# Patient Record
Sex: Female | Born: 1990 | Hispanic: Yes
Health system: Southern US, Community
[De-identification: ages and names within clinical notes are randomized; demographics above are authoritative.]

## PROBLEM LIST (undated history)

## (undated) HISTORY — PX: NO PAST SURGERIES: SHX2092

---

## 2019-05-09 ENCOUNTER — Ambulatory Visit: Payer: Self-pay | Attending: Internal Medicine

## 2019-05-09 DIAGNOSIS — Z23 Encounter for immunization: Secondary | ICD-10-CM

## 2019-05-09 NOTE — Progress Notes (Signed)
   Covid-19 Vaccination Clinic  Name:  Gabrielle Johnson    MRN: [id number] DOB: 1990/07/15  05/09/2019  [name] was observed post Covid-19 immunization for 15 minutes without incident. She was provided with Vaccine Information Sheet and instruction to access the V-Safe system.   [name] was instructed to call 911 with any severe reactions post vaccine: Marland Kitchen Difficulty breathing  . Swelling of face and throat  . A fast heartbeat  . A bad rash all over body  . Dizziness and weakness   Immunizations Administered    Name Date Dose VIS Date Route   Pfizer COVID-19 Vaccine 05/09/2019  5:15 PM 0.3 mL 01/25/2019 Intramuscular   Manufacturer: [employer], [employer]   Lot: [id number]   NDC: [address]-2

## 2019-06-03 ENCOUNTER — Ambulatory Visit: Payer: Self-pay | Attending: Internal Medicine

## 2019-06-03 DIAGNOSIS — Z23 Encounter for immunization: Secondary | ICD-10-CM

## 2019-06-03 NOTE — Progress Notes (Signed)
   Covid-19 Vaccination Clinic  Name:  Gabrielle Johnson    MRN: [id number] DOB: 08-08-1990  06/03/2019  [name] was observed post Covid-19 immunization for 15 minutes without incident. She was provided with Vaccine Information Sheet and instruction to access the V-Safe system.   [name] was instructed to call 911 with any severe reactions post vaccine: Marland Kitchen Difficulty breathing  . Swelling of face and throat  . A fast heartbeat  . A bad rash all over body  . Dizziness and weakness   Immunizations Administered    Name Date Dose VIS Date Route   Pfizer COVID-19 Vaccine 06/03/2019  4:38 PM 0.3 mL 04/10/2018 Intramuscular   Manufacturer: [employer], [employer]   Lot: [id number]   NDC: [address]-5

## 2019-12-24 ENCOUNTER — Other Ambulatory Visit: Payer: Self-pay

## 2019-12-24 DIAGNOSIS — Z20822 Contact with and (suspected) exposure to covid-19: Secondary | ICD-10-CM

## 2019-12-25 LAB — NOVEL CORONAVIRUS, NAA: SARS-CoV-2, NAA: NOT DETECTED

## 2019-12-25 LAB — SARS-COV-2, NAA 2 DAY TAT

## 2019-12-27 ENCOUNTER — Telehealth: Payer: Self-pay | Admitting: *Deleted

## 2019-12-27 NOTE — Telephone Encounter (Signed)
Pt notified of negative COVID-19 results. Understanding verbalized, with assistance from interpreter # [phone].

## 2020-04-09 LAB — OB RESULTS CONSOLE ANTIBODY SCREEN: Antibody Screen: NEGATIVE

## 2020-04-09 LAB — OB RESULTS CONSOLE ABO/RH: RH Type: POSITIVE

## 2020-04-09 LAB — OB RESULTS CONSOLE GC/CHLAMYDIA
Chlamydia: NEGATIVE
Gonorrhea: NEGATIVE

## 2020-04-09 LAB — OB RESULTS CONSOLE RPR: RPR: NONREACTIVE

## 2020-04-09 LAB — OB RESULTS CONSOLE RUBELLA ANTIBODY, IGM: Rubella: IMMUNE

## 2020-04-09 LAB — OB RESULTS CONSOLE HEPATITIS B SURFACE ANTIGEN: Hepatitis B Surface Ag: NEGATIVE

## 2020-04-09 LAB — OB RESULTS CONSOLE HIV ANTIBODY (ROUTINE TESTING): HIV: NONREACTIVE

## 2020-04-10 ENCOUNTER — Other Ambulatory Visit: Payer: Self-pay | Admitting: Nurse Practitioner

## 2020-04-10 DIAGNOSIS — Z369 Encounter for antenatal screening, unspecified: Secondary | ICD-10-CM

## 2020-04-27 ENCOUNTER — Encounter: Payer: Self-pay | Admitting: *Deleted

## 2020-04-28 ENCOUNTER — Other Ambulatory Visit: Payer: Self-pay

## 2020-04-28 ENCOUNTER — Encounter: Payer: Self-pay | Admitting: *Deleted

## 2020-04-28 ENCOUNTER — Ambulatory Visit: Payer: Self-pay | Attending: Obstetrics

## 2020-04-28 ENCOUNTER — Ambulatory Visit: Payer: Self-pay | Admitting: *Deleted

## 2020-04-28 ENCOUNTER — Ambulatory Visit: Payer: Self-pay

## 2020-04-28 VITALS — BP 110/50 | HR 79 | Ht 60.0 in | Wt 146.0 lb

## 2020-04-28 DIAGNOSIS — Z369 Encounter for antenatal screening, unspecified: Secondary | ICD-10-CM | POA: Insufficient documentation

## 2020-04-28 IMAGING — US US MFM FETAL NUCHAL TRANSLUCENCY
1 series · 14 of 28 positions shown · non-contrast
Comparison: none

[Series 1: us mfm fetal nuchal translucency · 14 of 98 slices shown]
[im 4/98]
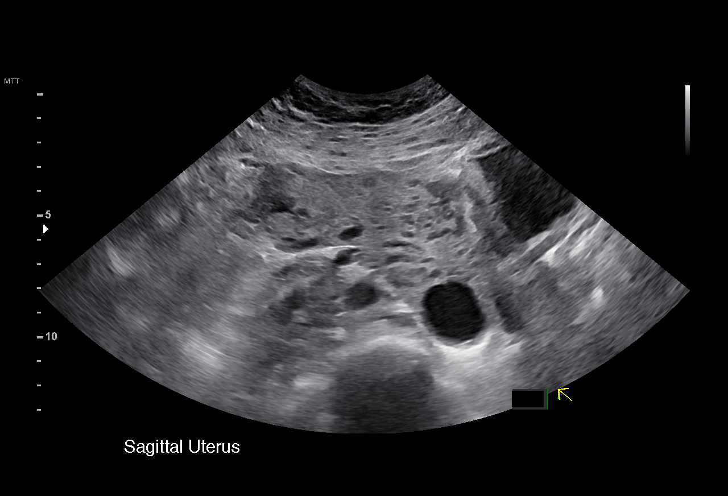
[im 11/98]
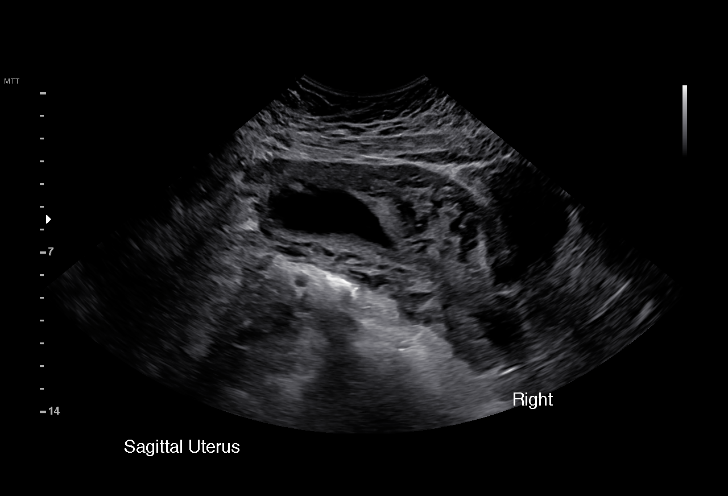
[im 18/98]
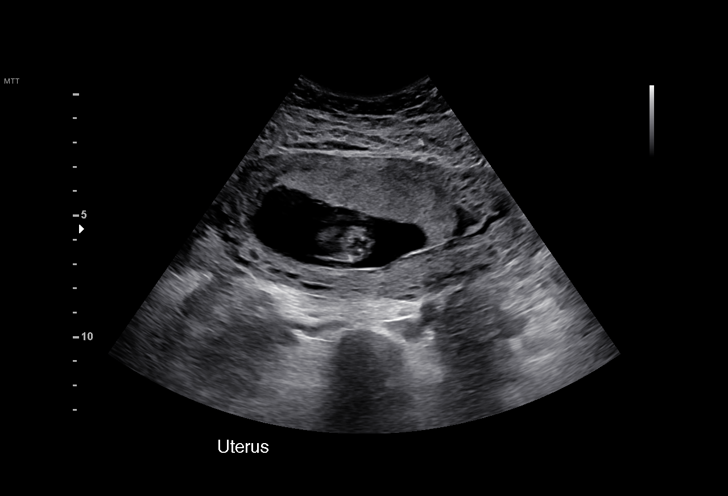
[im 26/98]
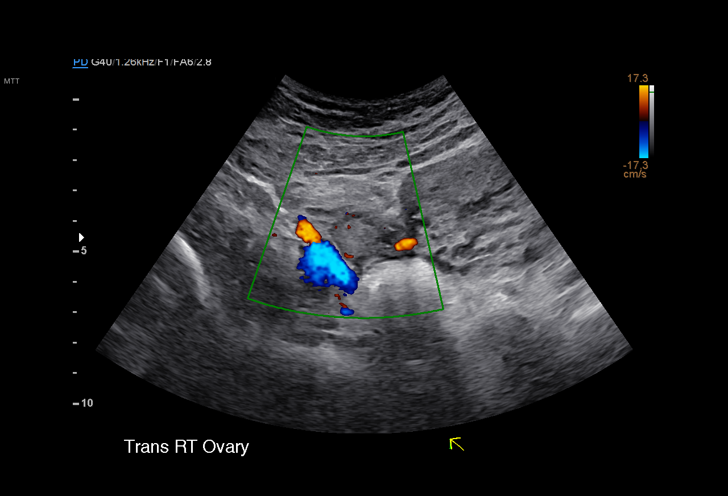
[im 33/98]
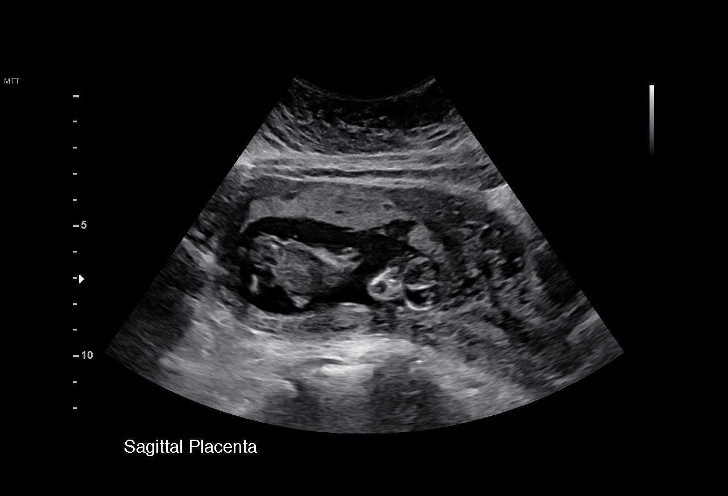
[im 40/98]
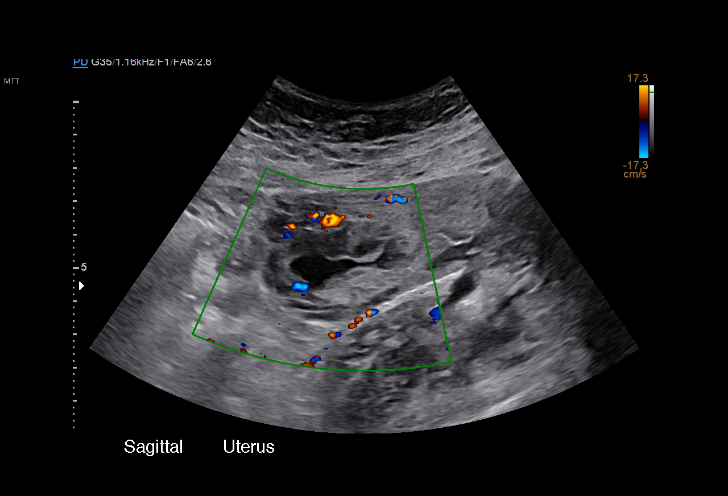
[im 47/98]
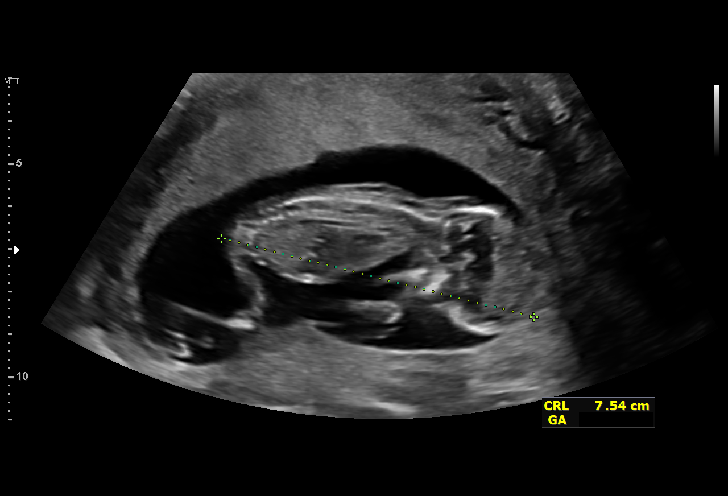
[im 54/98]
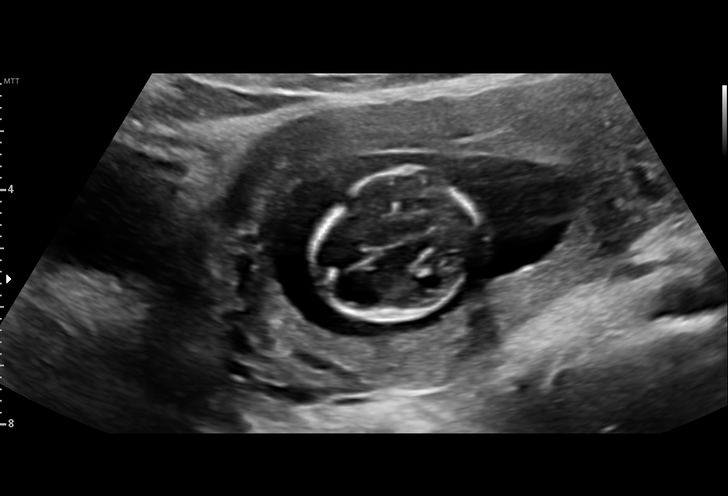
[im 62/98]
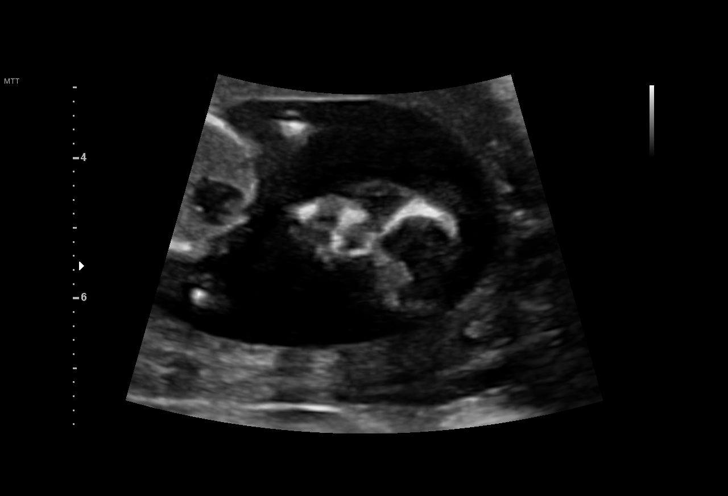
[im 69/98]
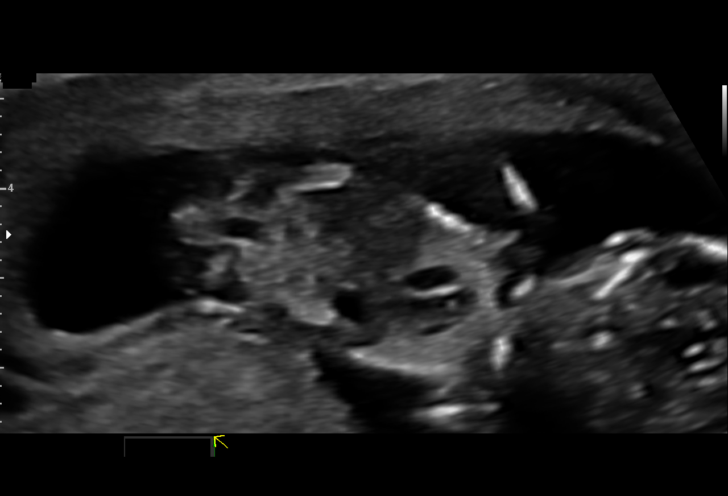
[im 76/98]
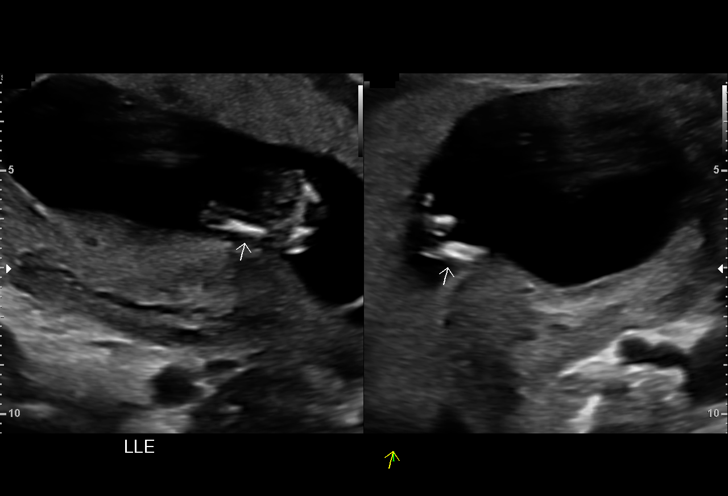
[im 83/98]
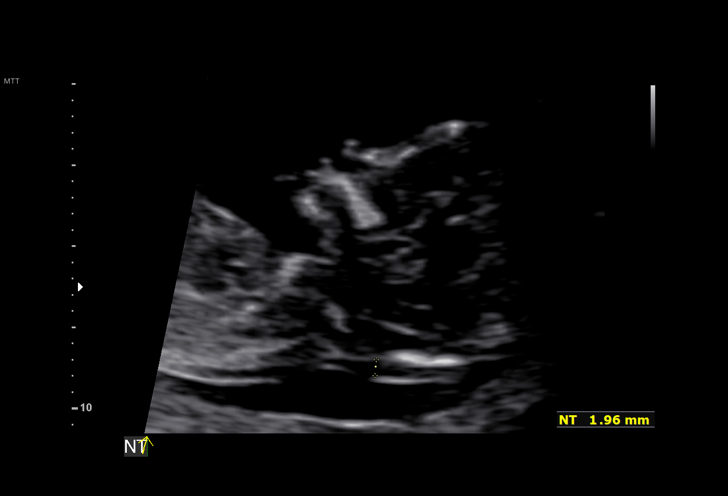
[im 90/98]
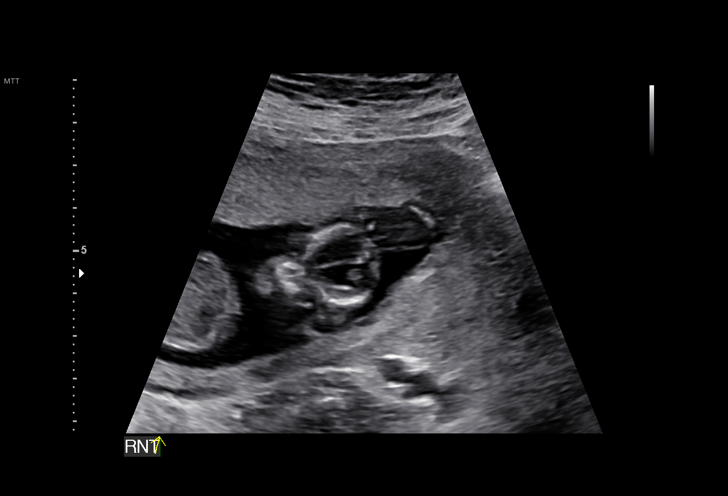
[im 98/98]
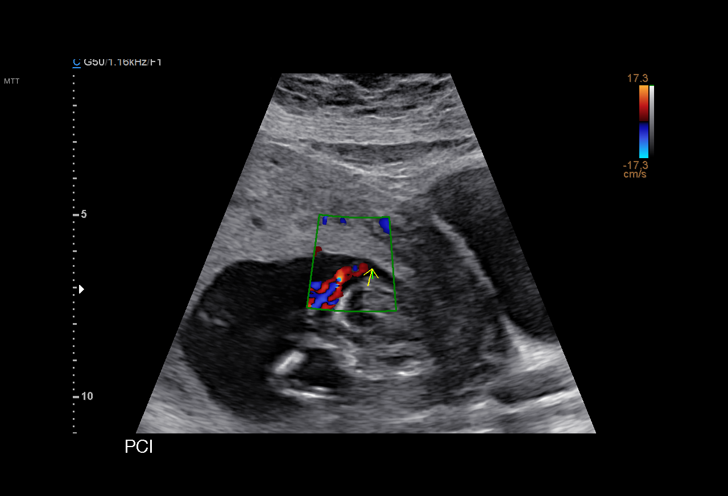

[14 of 28 positions shown; findings below may reference images not displayed]

Attending:        EMMANUEL APPIAH        Secondary Phy.:   EMMANUEL APPIAH
                                                            EMMANUEL APPIAH NP
                   [REDACTED]-                          [HOSPITAL] at
                   [73]

    TRANSLUCENCY

Indications

 13 weeks gestation of pregnancy
 Encounter for nuchal translucency              [73]
Fetal Evaluation

 Num Of Fetuses:         1
 Preg. Location:         Intrauterine
 Fetal Heart Rate(bpm):  145
 Cardiac Activity:       Observed
 Presentation:           Cephalic
 Placenta:               Anterior
 P. Cord Insertion:      Not well visualized

 Amniotic Fluid
 AFI FV:      Within normal limits

 Comment:    Possible Small subchorionic hemorrhage noted near Left fundus.
OB History

 Gravidity:    1         Term:   0        Prem:   0        SAB:   0
 TOP:          0       Ectopic:  0        Living: 0
Gestational Age

 LMP:           13w 1d        Date:  [DATE]                 EDD:   [DATE]
 Best:          13w 1d     Det. By:  LMP  ([DATE])          EDD:   [DATE]
1st Trimester Genetic Sonogram Screening

 CRL:            74.9  mm    G. Age:   13w 2d                 EDD:   [DATE]
 Nuc Trans:       2.0  mm

 Nasal Bone:                 Present
Anatomy

 Cranium:               Visualized             Abdominal Wall:         Visualized
 Choroid Plexus:        Visualized             Kidneys:                Visualized
 Posterior Fossa:       Visualized             Bladder:                Visualized
 Heart:                 Visualized             Spine:                  Visualized
 Diaphragm:             Visualized             Upper Extremities:      Visualized
 Stomach:               Visualized             Lower Extremities:      Visualized
 Abdomen:               Visualized

 Other:  Technically difficult due to early gestational age.
Cervix Uterus Adnexa

 Cervix
 Normal appearance by transabdominal scan.

 Uterus
 No abnormality visualized.

 Right Ovary
 Within normal limits.

 Left Ovary
 Within normal limits. Simple cyst measuring 2.8 cm.

 Cul De Sac
 No free fluid seen.

 Adnexa
 No abnormality visualized.
Impression

 On ultrasound, the CRL measurement is consistent with her
 previously-established dates and good fetal heart activity is
 seen. The nuchal translucency (NT) measures 2 millimeters,
 which is normal.  Fetal anatomy that could be ascertained at
 this gestational age is normal.

 I discussed the importance of first trimester screening, its
 significance, and limitations.  Patient understands that only
 invasive testing (chorionic villous sampling or amniocentesis)
 will give a definitive result on the fetal karyotype.
 Patient opted not to have first trimester screening.
 Counseled the patient with help of Spanish language
 interpreter (EMMANUEL APPIAH services).
Recommendations

 Fetal anatomy scan at 18-20 weeks.
                 EMMANUEL APPIAH

## 2020-09-11 ENCOUNTER — Inpatient Hospital Stay (HOSPITAL_COMMUNITY): Admission: AD | Admit: 2020-09-11 | Payer: Self-pay | Source: Home / Self Care | Admitting: Obstetrics and Gynecology

## 2020-10-23 ENCOUNTER — Other Ambulatory Visit: Payer: Self-pay | Admitting: Family Medicine

## 2020-10-23 DIAGNOSIS — Z3480 Encounter for supervision of other normal pregnancy, unspecified trimester: Secondary | ICD-10-CM | POA: Insufficient documentation

## 2020-10-23 MED ORDER — SOD CITRATE-CITRIC ACID 500-334 MG/5ML PO SOLN: 30.0000 mL | ORAL | Status: AC

## 2020-10-23 MED ORDER — CEFAZOLIN SODIUM-DEXTROSE 2-4 GM/100ML-% IV SOLN
2.0000 g | INTRAVENOUS | Status: AC
  Administered 2020-10-26: 2 g via INTRAVENOUS

## 2020-10-23 NOTE — Pre-Procedure Instructions (Signed)
Interpreter number [phone]

## 2020-10-24 NOTE — Anesthesia Preprocedure Evaluation (Addendum)
Anesthesia Evaluation  Patient identified by MRN, date of birth, ID band Patient awake    Reviewed: Allergy & Precautions, NPO status , Patient's Chart, lab work & pertinent test results  Airway Mallampati: III  TM Distance: >3 FB Neck ROM: Full    Dental no notable dental hx. (+) Dental Advisory Given   Pulmonary neg pulmonary ROS,    Pulmonary exam normal breath sounds clear to auscultation       Cardiovascular negative cardio ROS Normal cardiovascular exam Rhythm:Regular Rate:Normal     Neuro/Psych negative neurological ROS  negative psych ROS   GI/Hepatic negative GI ROS, Neg liver ROS,   Endo/Other  negative endocrine ROS  Renal/GU negative Renal ROS  negative genitourinary   Musculoskeletal negative musculoskeletal ROS (+)   Abdominal   Peds  Hematology negative hematology ROS (+)   Anesthesia Other Findings   Reproductive/Obstetrics (+) Pregnancy Breech                            Anesthesia Physical Anesthesia Plan  ASA: 2  Anesthesia Plan: Spinal   Post-op Pain Management:    Induction:   PONV Risk Score and Plan: 3 and Ondansetron, Dexamethasone and Treatment may vary due to age or medical condition  Airway Management Planned: Natural Airway and Nasal Cannula  Additional Equipment: None  Intra-op Plan:   Post-operative Plan:   Informed Consent: I have reviewed the patients History and Physical, chart, labs and discussed the procedure including the risks, benefits and alternatives for the proposed anesthesia with the patient or authorized representative who has indicated his/her understanding and acceptance.       Plan Discussed with: CRNA  Anesthesia Plan Comments:        Anesthesia Quick Evaluation

## 2020-10-25 NOTE — H&P (Signed)
OBSTETRIC ADMISSION HISTORY AND PHYSICAL  Gabrielle Johnson is a 30 y.o. female G1P0 with IUP at [redacted]w[redacted]d by LMP presenting for scheduled cesarean section for breech presentation. She reports +FMs, No LOF, no VB, no blurry vision, headaches or peripheral edema, and RUQ pain.  She plans on breast and bottle feeding. She request outpatient Nexplanon for birth control. She received her prenatal care at [facility]   Dating: By LMP --->  Estimated Date of Delivery: 11/02/20  Sono:    @[redacted]w[redacted]d , CWD, normal anatomy, breech presentation, posterior placental lie, 3250g, 71.8% EFW   Prenatal History/Complications:  ASCUS, HPV+ PAP > needs post partum colposcopy  Past Medical History: No past medical history on file.  Past Surgical History: Past Surgical History:  Procedure Laterality Date   NO PAST SURGERIES      Obstetrical History: OB History     Gravida  1   Para      Term      Preterm      AB      Living         SAB      IAB      Ectopic      Multiple      Live Births              Social History Social History   Socioeconomic History   Marital status: Unknown    Spouse name: Not on file   Number of children: Not on file   Years of education: Not on file   Highest education level: Not on file  Occupational History   Not on file  Tobacco Use   Smoking status: Not on file   Smokeless tobacco: Not on file  Substance and Sexual Activity   Alcohol use: Not on file   Drug use: Not on file   Sexual activity: Not on file  Other Topics Concern   Not on file  Social History Narrative   Not on file   Social Determinants of Health   Financial Resource Strain: Not on file  Food Insecurity: Not on file  Transportation Needs: Not on file  Physical Activity: Not on file  Stress: Not on file  Social Connections: Not on file    Family History: No family history on file.  Allergies: No Known Allergies  No medications prior to admission.     Review of Systems    All systems reviewed and negative except as stated in HPI  Last menstrual period 01/27/2020. General appearance: alert, cooperative, and appears stated age Lungs: clear to auscultation bilaterally Heart: regular rate and rhythm Abdomen: soft, non-tender; bowel sounds normal Extremities: no sign of DVT Fetal monitoring : 134 bpm by doppler     Prenatal labs: ABO, Rh: O/Positive/-- (02/24 0000) Antibody: Negative (02/24 0000) Rubella: Immune (02/24 0000) RPR: Nonreactive (02/24 0000)  HBsAg: Negative (02/24 0000)  HIV: Non-reactive (02/24 0000)  GBS:   Negative (10/08/2020 1 hr Glucola Normal (78) Genetic screening Negative Quad screen  Anatomy 10/10/2020 normal  Prenatal Transfer Tool  Maternal Diabetes: No Genetic Screening: Normal Maternal Ultrasounds/Referrals: Normal Fetal Ultrasounds or other Referrals:  None Maternal Substance Abuse:  No Significant Maternal Medications:  None Significant Maternal Lab Results: Group B Strep negative  No results found for this or any previous visit (from the past 24 hour(s)).  Patient Active Problem List   Diagnosis Date Noted   Supervision of other normal pregnancy, antepartum 10/23/2020    Assessment/Plan:  Gabrielle Johnson is a 29  y.o. G1P0 at [redacted]w[redacted]d here for Scheduled primary Cesarean Section for breech presentation  #Primary Scheduled Cesarean Section #Breech presentation The risks of cesarean section were discussed with the patient including but were not limited to: bleeding which may require transfusion or reoperation; infection which may require antibiotics; injury to bowel, bladder, ureters or other surrounding organs; injury to the fetus; need for additional procedures including hysterectomy in the event of a life-threatening hemorrhage; placental abnormalities wth subsequent pregnancies, incisional problems, thromboembolic phenomenon and other postoperative/anesthesia complications.  The patient concurred with the proposed plan,  giving informed written consent for the procedure.  Patient has been NPO since midnight she will remain NPO for procedure. Anesthesia and OR aware.  Preoperative prophylactic antibiotics and SCDs ordered on call to the OR.  To OR when ready.   #ID:  GBS negative #MOF: Breast and Bottle #MOC: Outpatient Nexplanon #Circ:  no  Venora Maples, MD/MPH Attending Family Medicine Physician, [facility] for [facility], [facility]  10/25/2020, 7:05 PM

## 2020-10-26 ENCOUNTER — Other Ambulatory Visit: Payer: Self-pay

## 2020-10-26 ENCOUNTER — Inpatient Hospital Stay (HOSPITAL_COMMUNITY)
Admission: AD | Admit: 2020-10-26 | Discharge: 2020-10-28 | DRG: 787 | Disposition: A | Discharge status: Home / Self Care | Payer: Medicaid Other | Attending: Obstetrics & Gynecology | Admitting: Obstetrics & Gynecology

## 2020-10-26 ENCOUNTER — Inpatient Hospital Stay (HOSPITAL_COMMUNITY): Payer: Medicaid Other | Admitting: Anesthesiology

## 2020-10-26 ENCOUNTER — Encounter (HOSPITAL_COMMUNITY): Payer: Self-pay | Admitting: Family Medicine

## 2020-10-26 ENCOUNTER — Encounter (HOSPITAL_COMMUNITY)
Admission: AD | Disposition: A | Discharge status: Home / Self Care | Payer: Self-pay | Source: Home / Self Care | Attending: Obstetrics & Gynecology

## 2020-10-26 ENCOUNTER — Other Ambulatory Visit: Payer: Self-pay | Admitting: Obstetrics & Gynecology

## 2020-10-26 DIAGNOSIS — Z20822 Contact with and (suspected) exposure to covid-19: Secondary | ICD-10-CM | POA: Diagnosis present

## 2020-10-26 DIAGNOSIS — R339 Retention of urine, unspecified: Secondary | ICD-10-CM | POA: Diagnosis not present

## 2020-10-26 DIAGNOSIS — O321XX0 Maternal care for breech presentation, not applicable or unspecified: Secondary | ICD-10-CM

## 2020-10-26 DIAGNOSIS — O9081 Anemia of the puerperium: Secondary | ICD-10-CM | POA: Diagnosis not present

## 2020-10-26 DIAGNOSIS — O99893 Other specified diseases and conditions complicating puerperium: Secondary | ICD-10-CM | POA: Diagnosis not present

## 2020-10-26 DIAGNOSIS — Z3A39 39 weeks gestation of pregnancy: Secondary | ICD-10-CM

## 2020-10-26 DIAGNOSIS — Z3480 Encounter for supervision of other normal pregnancy, unspecified trimester: Secondary | ICD-10-CM

## 2020-10-26 DIAGNOSIS — D62 Acute posthemorrhagic anemia: Secondary | ICD-10-CM | POA: Diagnosis not present

## 2020-10-26 DIAGNOSIS — Z98891 History of uterine scar from previous surgery: Secondary | ICD-10-CM

## 2020-10-26 LAB — CBC
HCT: 40.2 % (ref 36.0–46.0)
HCT: 35.7 % — ABNORMAL LOW (ref 36.0–46.0)
Hemoglobin: 13.4 g/dL (ref 12.0–15.0)
Hemoglobin: 11.9 g/dL — ABNORMAL LOW (ref 12.0–15.0)
MCH: 28.5 pg (ref 26.0–34.0)
MCH: 27.9 pg (ref 26.0–34.0)
MCHC: 33.3 g/dL (ref 30.0–36.0)
MCHC: 33.3 g/dL (ref 30.0–36.0)
MCV: 85.5 fL (ref 80.0–100.0)
MCV: 83.8 fL (ref 80.0–100.0)
Platelets: 324 10*3/uL (ref 150–400)
Platelets: 295 10*3/uL (ref 150–400)
RBC: 4.7 MIL/uL (ref 3.87–5.11)
RBC: 4.26 MIL/uL (ref 3.87–5.11)
RDW: 13.9 % (ref 11.5–15.5)
RDW: 14 % (ref 11.5–15.5)
WBC: 9.1 10*3/uL (ref 4.0–10.5)
WBC: 20.7 10*3/uL — ABNORMAL HIGH (ref 4.0–10.5)
nRBC: 0 % (ref 0.0–0.2)
nRBC: 0 % (ref 0.0–0.2)

## 2020-10-26 LAB — RAPID HIV SCREEN (HIV 1/2 AB+AG)
HIV 1/2 Antibodies: NONREACTIVE
HIV-1 P24 Antigen - HIV24: NONREACTIVE

## 2020-10-26 LAB — RESP PANEL BY RT-PCR (FLU A&B, COVID) ARPGX2
Influenza A by PCR: NEGATIVE
Influenza B by PCR: NEGATIVE
SARS Coronavirus 2 by RT PCR: NEGATIVE

## 2020-10-26 LAB — TYPE AND SCREEN
ABO/RH(D): O POS
Antibody Screen: NEGATIVE

## 2020-10-26 LAB — RPR: RPR Ser Ql: NONREACTIVE

## 2020-10-26 LAB — CREATININE, SERUM
Creatinine, Ser: 0.53 mg/dL (ref 0.44–1.00)
GFR, Estimated: >60 mL/min (ref >60)

## 2020-10-26 SURGERY — Surgical Case
Anesthesia: Spinal | Wound class: Clean Contaminated

## 2020-10-26 MED ORDER — LACTATED RINGERS IV SOLN: INTRAVENOUS | Status: DC

## 2020-10-26 MED ORDER — OXYCODONE HCL 5 MG PO TABS
5.0000 mg | ORAL_TABLET | Freq: Once | ORAL | Status: DC | PRN
Start: 2020-10-26 — End: 2020-10-26

## 2020-10-26 MED ORDER — DIPHENHYDRAMINE HCL 25 MG PO CAPS: 25.0000 mg | ORAL_CAPSULE | Freq: Four times a day (QID) | ORAL | Status: DC | PRN

## 2020-10-26 MED ORDER — DIPHENHYDRAMINE HCL 25 MG PO CAPS: 25.0000 mg | ORAL_CAPSULE | ORAL | Status: DC | PRN

## 2020-10-26 MED ORDER — CEFAZOLIN SODIUM-DEXTROSE 2-4 GM/100ML-% IV SOLN
INTRAVENOUS | Status: AC
  Filled 2020-10-26: qty 100

## 2020-10-26 MED ORDER — FENTANYL CITRATE (PF) 100 MCG/2ML IJ SOLN
INTRAMUSCULAR | Status: AC
  Filled 2020-10-26: qty 2

## 2020-10-26 MED ORDER — OXYCODONE-ACETAMINOPHEN 5-325 MG PO TABS
2.0000 | ORAL_TABLET | ORAL | Status: DC | PRN
Start: 2020-10-26 — End: 2020-10-27

## 2020-10-26 MED ORDER — HYDROMORPHONE HCL 1 MG/ML IJ SOLN: 0.2500 mg | INTRAMUSCULAR | Status: DC | PRN

## 2020-10-26 MED ORDER — POVIDONE-IODINE 10 % EX SWAB
2.0000 "application " | Freq: Once | CUTANEOUS | Status: AC
  Administered 2020-10-26: 2 via TOPICAL

## 2020-10-26 MED ORDER — MEPERIDINE HCL 25 MG/ML IJ SOLN: 6.2500 mg | INTRAMUSCULAR | Status: DC | PRN

## 2020-10-26 MED ORDER — DIPHENHYDRAMINE HCL 50 MG/ML IJ SOLN: 12.5000 mg | INTRAMUSCULAR | Status: DC | PRN

## 2020-10-26 MED ORDER — MORPHINE SULFATE (PF) 0.5 MG/ML IJ SOLN
INTRAMUSCULAR | Status: AC
  Filled 2020-10-26: qty 10

## 2020-10-26 MED ORDER — SODIUM CHLORIDE 0.9% FLUSH: 3.0000 mL | INTRAVENOUS | Status: DC | PRN

## 2020-10-26 MED ORDER — MEASLES, MUMPS & RUBELLA VAC IJ SOLR: 0.5000 mL | Freq: Once | INTRAMUSCULAR | Status: DC

## 2020-10-26 MED ORDER — ACETAMINOPHEN 500 MG PO TABS
1000.0000 mg | ORAL_TABLET | Freq: Four times a day (QID) | ORAL | Status: AC
  Administered 2020-10-26 – 2020-10-27 (×4): 1000 mg via ORAL
  Filled 2020-10-26 (×4): qty 2

## 2020-10-26 MED ORDER — DEXAMETHASONE SODIUM PHOSPHATE 10 MG/ML IJ SOLN
INTRAMUSCULAR | Status: AC
  Filled 2020-10-26: qty 1

## 2020-10-26 MED ORDER — ENOXAPARIN SODIUM 40 MG/0.4ML IJ SOSY
40.0000 mg | PREFILLED_SYRINGE | INTRAMUSCULAR | Status: DC
  Administered 2020-10-26 – 2020-10-28 (×2): 40 mg via SUBCUTANEOUS
  Filled 2020-10-26 (×2): qty 0.4

## 2020-10-26 MED ORDER — KETOROLAC TROMETHAMINE 30 MG/ML IJ SOLN
INTRAMUSCULAR | Status: AC
  Filled 2020-10-26: qty 1

## 2020-10-26 MED ORDER — SCOPOLAMINE 1 MG/3DAYS TD PT72
MEDICATED_PATCH | TRANSDERMAL | Status: AC
  Filled 2020-10-26: qty 1

## 2020-10-26 MED ORDER — DEXAMETHASONE SODIUM PHOSPHATE 10 MG/ML IJ SOLN
INTRAMUSCULAR | Status: DC | PRN
  Administered 2020-10-26: 10 mg via INTRAVENOUS

## 2020-10-26 MED ORDER — NALBUPHINE HCL 10 MG/ML IJ SOLN
5.0000 mg | Freq: Once | INTRAMUSCULAR | Status: DC | PRN
Start: 2020-10-26 — End: 2020-10-28

## 2020-10-26 MED ORDER — OXYTOCIN-SODIUM CHLORIDE 30-0.9 UT/500ML-% IV SOLN
INTRAVENOUS | Status: DC | PRN
  Administered 2020-10-26: 30 [IU] via INTRAVENOUS

## 2020-10-26 MED ORDER — NALBUPHINE HCL 10 MG/ML IJ SOLN: 5.0000 mg | Freq: Once | INTRAMUSCULAR | Status: DC | PRN

## 2020-10-26 MED ORDER — FENTANYL CITRATE (PF) 100 MCG/2ML IJ SOLN
INTRAMUSCULAR | Status: DC | PRN
  Administered 2020-10-26: 15 ug via INTRATHECAL

## 2020-10-26 MED ORDER — MORPHINE SULFATE (PF) 0.5 MG/ML IJ SOLN
INTRAMUSCULAR | Status: DC | PRN
  Administered 2020-10-26: .15 mg via INTRATHECAL

## 2020-10-26 MED ORDER — DIBUCAINE (PERIANAL) 1 % EX OINT: 1.0000 "application " | TOPICAL_OINTMENT | CUTANEOUS | Status: DC | PRN

## 2020-10-26 MED ORDER — STERILE WATER FOR IRRIGATION IR SOLN
Status: DC | PRN
  Administered 2020-10-26: 1

## 2020-10-26 MED ORDER — SENNOSIDES-DOCUSATE SODIUM 8.6-50 MG PO TABS
2.0000 | ORAL_TABLET | Freq: Every day | ORAL | Status: DC
  Administered 2020-10-27 – 2020-10-28 (×2): 2 via ORAL
  Filled 2020-10-26 (×2): qty 2

## 2020-10-26 MED ORDER — NALOXONE HCL 0.4 MG/ML IJ SOLN: 0.4000 mg | INTRAMUSCULAR | Status: DC | PRN

## 2020-10-26 MED ORDER — KETOROLAC TROMETHAMINE 30 MG/ML IJ SOLN: 30.0000 mg | Freq: Four times a day (QID) | INTRAMUSCULAR | Status: AC | PRN

## 2020-10-26 MED ORDER — SIMETHICONE 80 MG PO CHEW
80.0000 mg | CHEWABLE_TABLET | ORAL | Status: DC | PRN
  Administered 2020-10-26: 80 mg via ORAL

## 2020-10-26 MED ORDER — PHENYLEPHRINE HCL-NACL 20-0.9 MG/250ML-% IV SOLN
INTRAVENOUS | Status: DC | PRN
  Administered 2020-10-26: 60 ug/min via INTRAVENOUS

## 2020-10-26 MED ORDER — SCOPOLAMINE 1 MG/3DAYS TD PT72
1.0000 | MEDICATED_PATCH | Freq: Once | TRANSDERMAL | Status: DC
  Administered 2020-10-26: 1.5 mg via TRANSDERMAL

## 2020-10-26 MED ORDER — SIMETHICONE 80 MG PO CHEW
80.0000 mg | CHEWABLE_TABLET | Freq: Three times a day (TID) | ORAL | Status: DC
  Administered 2020-10-27 – 2020-10-28 (×4): 80 mg via ORAL
  Filled 2020-10-26 (×5): qty 1

## 2020-10-26 MED ORDER — MENTHOL 3 MG MT LOZG: 1.0000 | LOZENGE | OROMUCOSAL | Status: DC | PRN

## 2020-10-26 MED ORDER — NALOXONE HCL 4 MG/10ML IJ SOLN
1.0000 ug/kg/h | INTRAVENOUS | Status: DC | PRN
  Filled 2020-10-26: qty 5

## 2020-10-26 MED ORDER — ZOLPIDEM TARTRATE 5 MG PO TABS: 5.0000 mg | ORAL_TABLET | Freq: Every evening | ORAL | Status: DC | PRN

## 2020-10-26 MED ORDER — PRENATAL MULTIVITAMIN CH
1.0000 | ORAL_TABLET | Freq: Every day | ORAL | Status: DC
  Administered 2020-10-27 – 2020-10-28 (×2): 1 via ORAL
  Filled 2020-10-26 (×2): qty 1

## 2020-10-26 MED ORDER — OXYTOCIN-SODIUM CHLORIDE 30-0.9 UT/500ML-% IV SOLN
INTRAVENOUS | Status: AC
  Filled 2020-10-26: qty 500

## 2020-10-26 MED ORDER — CEFAZOLIN SODIUM-DEXTROSE 2-4 GM/100ML-% IV SOLN: 2.0000 g | INTRAVENOUS | Status: DC

## 2020-10-26 MED ORDER — NALBUPHINE HCL 10 MG/ML IJ SOLN: 5.0000 mg | INTRAMUSCULAR | Status: DC | PRN

## 2020-10-26 MED ORDER — COCONUT OIL OIL: 1.0000 "application " | TOPICAL_OIL | Status: DC | PRN

## 2020-10-26 MED ORDER — ONDANSETRON HCL 4 MG/2ML IJ SOLN
INTRAMUSCULAR | Status: DC | PRN
  Administered 2020-10-26: 4 mg via INTRAVENOUS

## 2020-10-26 MED ORDER — BUPIVACAINE IN DEXTROSE 0.75-8.25 % IT SOLN
INTRATHECAL | Status: DC | PRN
  Administered 2020-10-26: 1.8 mL via INTRATHECAL

## 2020-10-26 MED ORDER — OXYCODONE HCL 5 MG/5ML PO SOLN
5.0000 mg | Freq: Once | ORAL | Status: DC | PRN
Start: 2020-10-26 — End: 2020-10-26

## 2020-10-26 MED ORDER — ONDANSETRON HCL 4 MG/2ML IJ SOLN
INTRAMUSCULAR | Status: AC
  Filled 2020-10-26: qty 2

## 2020-10-26 MED ORDER — OXYTOCIN-SODIUM CHLORIDE 30-0.9 UT/500ML-% IV SOLN
2.5000 [IU]/h | INTRAVENOUS | Status: AC
  Administered 2020-10-26: 2.5 [IU]/h via INTRAVENOUS

## 2020-10-26 MED ORDER — WITCH HAZEL-GLYCERIN EX PADS: 1.0000 "application " | MEDICATED_PAD | CUTANEOUS | Status: DC | PRN

## 2020-10-26 MED ORDER — ACETAMINOPHEN 325 MG PO TABS: 650.0000 mg | ORAL_TABLET | ORAL | Status: DC | PRN

## 2020-10-26 MED ORDER — PROMETHAZINE HCL 25 MG/ML IJ SOLN: 6.2500 mg | INTRAMUSCULAR | Status: DC | PRN

## 2020-10-26 MED ORDER — KETOROLAC TROMETHAMINE 30 MG/ML IJ SOLN
30.0000 mg | Freq: Once | INTRAMUSCULAR | Status: AC | PRN
  Administered 2020-10-26: 30 mg via INTRAVENOUS

## 2020-10-26 MED ORDER — ONDANSETRON HCL 4 MG/2ML IJ SOLN: 4.0000 mg | Freq: Three times a day (TID) | INTRAMUSCULAR | Status: DC | PRN

## 2020-10-26 MED ORDER — TETANUS-DIPHTH-ACELL PERTUSSIS 5-2.5-18.5 LF-MCG/0.5 IM SUSY: 0.5000 mL | PREFILLED_SYRINGE | Freq: Once | INTRAMUSCULAR | Status: DC

## 2020-10-26 MED ORDER — KETOROLAC TROMETHAMINE 30 MG/ML IJ SOLN
30.0000 mg | Freq: Four times a day (QID) | INTRAMUSCULAR | Status: AC | PRN
Start: 2020-10-26 — End: 2020-10-27
  Administered 2020-10-27: 30 mg via INTRAVENOUS
  Filled 2020-10-26: qty 1

## 2020-10-26 SURGICAL SUPPLY — 35 items
BENZOIN TINCTURE PRP APPL 2/3 (GAUZE/BANDAGES/DRESSINGS) ×2 IMPLANT
CHLORAPREP W/TINT 26ML (MISCELLANEOUS) ×2 IMPLANT
CLAMP CORD UMBIL (MISCELLANEOUS) IMPLANT
CLOTH BEACON ORANGE TIMEOUT ST (SAFETY) ×2 IMPLANT
DRSG OPSITE POSTOP 4X10 (GAUZE/BANDAGES/DRESSINGS) ×2 IMPLANT
ELECT REM PT RETURN 9FT ADLT (ELECTROSURGICAL) ×2
ELECTRODE REM PT RTRN 9FT ADLT (ELECTROSURGICAL) ×1 IMPLANT
EXTRACTOR VACUUM BELL STYLE (SUCTIONS) IMPLANT
GLOVE BIOGEL PI IND STRL 7.0 (GLOVE) ×2 IMPLANT
GLOVE BIOGEL PI INDICATOR 7.0 (GLOVE) ×2
GLOVE ECLIPSE 7.0 STRL STRAW (GLOVE) ×2 IMPLANT
GOWN STRL REUS W/TWL LRG LVL3 (GOWN DISPOSABLE) ×4 IMPLANT
KIT ABG SYR 3ML LUER SLIP (SYRINGE) ×2 IMPLANT
NEEDLE HYPO 25X5/8 SAFETYGLIDE (NEEDLE) ×2 IMPLANT
NS IRRIG 1000ML POUR BTL (IV SOLUTION) ×2 IMPLANT
PACK C SECTION WH (CUSTOM PROCEDURE TRAY) ×2 IMPLANT
PAD OB MATERNITY 4.3X12.25 (PERSONAL CARE ITEMS) ×2 IMPLANT
PENCIL SMOKE EVAC W/HOLSTER (ELECTROSURGICAL) ×2 IMPLANT
RETAINER VISCERAL (MISCELLANEOUS) ×2 IMPLANT
RTRCTR C-SECT PINK 25CM LRG (MISCELLANEOUS) ×2 IMPLANT
STRIP CLOSURE SKIN 1/2X4 (GAUZE/BANDAGES/DRESSINGS) ×2 IMPLANT
SUT MNCRL 0 VIOLET CTX 36 (SUTURE) ×2 IMPLANT
SUT MONOCRYL 0 CTX 36 (SUTURE) ×2
SUT PLAIN 0 NONE (SUTURE) IMPLANT
SUT PLAIN 2 0 (SUTURE) ×1
SUT PLAIN 2 0 XLH (SUTURE) IMPLANT
SUT PLAIN ABS 2-0 CT1 27XMFL (SUTURE) ×1 IMPLANT
SUT VIC AB 0 CTX 36 (SUTURE) ×1
SUT VIC AB 0 CTX36XBRD ANBCTRL (SUTURE) ×1 IMPLANT
SUT VIC AB 2-0 CT1 27 (SUTURE) ×1
SUT VIC AB 2-0 CT1 TAPERPNT 27 (SUTURE) ×1 IMPLANT
SUT VIC AB 4-0 KS 27 (SUTURE) ×2 IMPLANT
TOWEL OR 17X24 6PK STRL BLUE (TOWEL DISPOSABLE) ×2 IMPLANT
TRAY FOLEY W/BAG SLVR 14FR LF (SET/KITS/TRAYS/PACK) IMPLANT
WATER STERILE IRR 1000ML POUR (IV SOLUTION) ×2 IMPLANT

## 2020-10-26 NOTE — Discharge Summary (Signed)
Postpartum Discharge Summary      Patient Name: Gabrielle Johnson DOB: 02-27-1990 MRN: [id number]  Date of admission: 10/26/2020 Delivery date:10/26/2020  Delivering provider: Clarnce Flock  Date of discharge: 10/28/2020  Admitting diagnosis: S/P cesarean section [Z98.891] Intrauterine pregnancy: [redacted]w[redacted]d    Secondary diagnosis:  Active Problems:   Supervision of other normal pregnancy, antepartum   S/P cesarean section   Cesarean delivery delivered  Additional problems: None   Discharge diagnosis: Term Pregnancy Delivered                                              Post partum procedures: none Augmentation: None, scheduled c-section Complications: None  Hospital course: Scheduled C/S   30y.o. yo G1P1001 at 330w0das admitted to the hospital 10/26/2020 for scheduled cesarean section with the following indication:Malpresentation.Delivery details are as follows:  Membrane Rupture Time/Date: 10:35 AM ,10/26/2020   Delivery Method:C-Section, Low Transverse  Details of operation can be found in separate operative note.  Patient had a postpartum course remarkable for some urinary retention requiring her to have an indwelling catheter replaced overnight into POD#2, but after removal she is urinating without difficulty.  She is ambulating, tolerating a regular diet and passing flatus. Patient is discharged home in stable condition on  10/28/20 per her request for early d/c as long as the baby can go as well.        Newborn Data: Birth date:10/26/2020  Birth time:10:35 AM  Gender:Female  Living status:Living  Apgars:8 ,9  Weight:3375 g     Magnesium Sulfate received: No BMZ received: No Rhophylac:N/A MMR:N/A T-DaP:Given prenatally Flu: N/A Transfusion:No  Physical exam  Vitals:   10/27/20 0308 10/27/20 0800 10/27/20 1440 10/28/20 0602  BP: 105/65 108/63 120/73 118/79  Pulse: 68 64 69 64  Resp: _0 Temp: 98.2 F (36.8 C) 98.3 F (36.8 C) 98.1 F (36.7 C) 98 F  (36.7 C)  TempSrc: Oral Oral Oral Oral  SpO2: 99% 100% 99% 100%  Weight:      Height:       General: alert and cooperative Lochia: appropriate Uterine Fundus: firm Incision: honeycomb marked and unchanged DVT Evaluation: No evidence of DVT seen on physical exam. Labs: Lab Results  Component Value Date   WBC 12.7 (H) 10/27/2020   HGB 9.8 (L) 10/27/2020   HCT 29.3 (L) 10/27/2020   MCV 84.0 10/27/2020   PLT 255 10/27/2020   CMP Latest Ref Rng & Units 10/26/2020  Creatinine 0.44 - 1.00 mg/dL 0.53   Edinburgh Score: No flowsheet data found.   After visit meds:  Allergies as of 10/28/2020   No Known Allergies      Medication List     STOP taking these medications    clotrimazole 1 % cream Commonly known as: LOTRIMIN   hydrOXYzine 25 MG tablet Commonly known as: ATARAX/VISTARIL       TAKE these medications    ferrous sulfate 325 (65 FE) MG tablet Take 1 tablet (325 mg total) by mouth every other day.   ibuprofen 600 MG tablet Commonly known as: ADVIL Take 1 tablet (600 mg total) by mouth every 6 (six) hours as needed.   multivitamin-prenatal 27-0.8 MG Tabs tablet Take 1 tablet by mouth daily.   oxyCODONE 5 MG immediate release tablet Commonly known as: Oxy IR/ROXICODONE Take 1-2 tablets (  5-10 mg total) by mouth every 4 (four) hours as needed for moderate pain, severe pain or breakthrough pain.         Discharge home in stable condition Infant Feeding: Breast Infant Disposition:home with mother Discharge instruction: per After Visit Summary and Postpartum booklet. Activity: Advance as tolerated. Pelvic rest for 6 weeks.  Diet: routine diet Future Appointments: Future Appointments  Date Time Provider [place]  11/03/2020 10:20 AM [facility] NURSE [facility]   Follow up Visit:  Follow-up Information     Department, [facility]. Schedule an appointment as soon as possible for a visit in 4 week(s).   Why: For your postpartum  appointment. Contact information: [place] [address] [phone]                Message sent to [facility] by Dr. Cy Blamer on 9/12 for 1 week incision check Patient to call [facility] for 4-6 wk F/up PP appt  Please schedule this patient for a In person postpartum visit in 4 weeks with the following provider: Any provider. Additional Postpartum F/U:Incision check 1 week  Low risk pregnancy complicated by:  None Delivery mode:  C-Section, Low Transverse  Anticipated Birth Control:   Declines at this time - plans for Nexplanon at [facility]   10/28/2020 Myrtis Ser, CNM 11:09 AM

## 2020-10-26 NOTE — Anesthesia Procedure Notes (Signed)
Spinal  Patient location during procedure: OR Start time: 10/26/2020 10:02 AM End time: 10/26/2020 10:07 AM Reason for block: surgical anesthesia Staffing Performed: anesthesiologist  Anesthesiologist: Lannie Fields, DO Preanesthetic Checklist Completed: patient identified, IV checked, risks and benefits discussed, surgical consent, monitors and equipment checked, pre-op evaluation and timeout performed Spinal Block Patient position: sitting Prep: DuraPrep and site prepped and draped Patient monitoring: cardiac monitor, continuous pulse ox and blood pressure Approach: midline Location: L3-4 Injection technique: single-shot Needle Needle type: Pencan  Needle gauge: 24 G Needle length: 9 cm Assessment Sensory level: T6 Events: CSF return Additional Notes Functioning IV was confirmed and monitors were applied. Sterile prep and drape, including hand hygiene and sterile gloves were used. The patient was positioned and the spine was prepped. The skin was anesthetized with lidocaine.  Free flow of clear CSF was obtained prior to injecting local anesthetic into the CSF.  The spinal needle aspirated freely following injection.  The needle was carefully withdrawn.  The patient tolerated the procedure well.

## 2020-10-26 NOTE — Op Note (Addendum)
Operative Note   Patient: Gabrielle Johnson  Date of Procedure: 10/26/2020  Procedure: Primary Low Transverse Cesarean   Indications:  Breech presentation  Pre-operative Diagnosis: Breech, [redacted] weeks gestation  Post-operative Diagnosis: Same  TOLAC Candidate: Yes   Surgeon: Surgeon(s) and Role:    * Venora Maples, MD - Primary    * Warner Mccreedy, MD - Assisting   An experienced assistant was required given the standard of surgical care given the complexity of the case.  This assistant was needed for exposure, dissection, suctioning, retraction, instrument exchange, assisting with delivery with administration of fundal pressure, and for overall help during the procedure.   Anesthesia: spinal  Anesthesiologist: Dr. Lovell Sheehan   Antibiotics: Cefazolin   Estimated Blood Loss: 500 ml   Total IV Fluids: 2000 ml  Urine Output:  350 cc OF clear urine  Specimens: None   Complications: no complications   Indications: Gabrielle Johnson is a 30 y.o. G1P1001 with an IUP [redacted]w[redacted]d presenting for scheduled cesarean secondary to the indications listed above. Clinical course notable for uncomplicated course in PACU prior to scheduled cesarean delivery.  The risks of cesarean section discussed with the patient included but were not limited to: bleeding which may require transfusion or reoperation; infection which may require antibiotics; injury to bowel, bladder, ureters or other surrounding organs; injury to the fetus; need for additional procedures including hysterectomy in the event of a life-threatening hemorrhage; placental abnormalities with subsequent pregnancies, incisional problems, thromboembolic phenomenon and other postoperative/anesthesia complications. The patient concurred with the proposed plan, giving informed written consent for the procedure. Patient has been NPO since last night she will remain NPO for procedure. Anesthesia and OR aware. Preoperative prophylactic antibiotics and SCDs  ordered on call to the OR.   Findings: Viable infant in frank breech presentation, no nuchal cord present, unusual molding of the head particularly the occiput. Apgars 8 , 9 , . Weight 7 lb 7.1 oz (3.375 kg) . Clear amniotic fluid. Normal placenta, three vessel cord. Normal uterus, Normal bilateral fallopian tubes, Normal bilateral ovaries.  Procedure Details: A Time Out was held and the above information confirmed. The patient received intravenous antibiotics and had sequential compression devices applied to her lower extremities preoperatively. The patient was taken back to the operative suite where spinal anesthesia was administered. After induction of anesthesia, the patient was draped and prepped in the usual sterile manner and placed in a dorsal supine position with a leftward tilt. A low transverse skin incision was made with scalpel and carried down through the subcutaneous tissue to the fascia. Fascial incision was made and extended transversely. The fascia was separated from the underlying rectus tissue superiorly and inferiorly. The rectus muscles were separated in the midline bluntly and the peritoneum was entered bluntly. An Alexis retractor was placed to aid in visualization of the uterus. A bladder flap was not developed. A low transverse uterine incision was made. The infant was successfully delivered from frank breech  presentation, the umbilical cord was clamped after 1 minute. Cord ph was not sent, and cord blood was obtained for evaluation. The placenta was removed Intact and appeared normal. The uterine incision was closed with running locked sutures of 0-Monocryl, and then a second imbricating layer was also placed with 0-Monocryl. Overall, excellent hemostasis was noted. The abdomen and the pelvis were cleared of all clot and debris and the Jon Gills was removed. Hemostasis was confirmed on all surfaces.  The peritoneum was reapproximated using 2-0 vicryl . The fascia was then  closed using 0  Vicryl in a running fashion. The subcutaneous layer was reapproximated with plain gut and the skin was closed with a 4-0 vicryl subcuticular stitch. The patient tolerated the procedure well. Sponge, lap, instrument and needle counts were correct x 2. She was taken to the recovery room in stable condition.  Disposition: PACU - hemodynamically stable.    Signed: Warner Mccreedy, MD, MPH OB Fellow Center for [facility])    Attestation of Attending Supervision of Obstetric Fellow During Surgery: An experienced assistant was required given the standard of surgical care given the complexity of the case.  This assistant was needed for exposure, dissection, suctioning, retraction, instrument exchange, assisting with delivery with administration of fundal pressure, and for overall help during the procedure. Surgery was performed with the Obstetric Fellow under my supervision and collaboration.  I was present and scrubbed for the entire procedure.   I have reviewed the Obstetric Fellow's operative report, and I agree with the documentation. I have also made any necessary editorial changes.  Venora Maples, MD/MPH Attending Family Medicine Physician, [facility] for [facility], [facility]

## 2020-10-26 NOTE — Lactation Note (Addendum)
This note was copied from a baby's chart. Lactation Consultation Note  Patient Name: Gabrielle Johnson [id number] Date: 10/26/2020 Reason for consult: Initial assessment;Primapara;1st time breastfeeding Age:31 hours Consult was done in Spanish:  Initial visit to 7 hours old infant of a P1 mother. Mother requests assistance with latch. LC demonstrated alignment, support pillows, and hand expression. " Mateo" latches at once, breastfed for 10 minutes, audible swallows. Spoonfed ~23mL. Discussed normal newborn behavior and patterns, signs of good milk transfer, hunger cues, tummy size and benefits of skin to skin.   Plan: 1-Deep, comfortable latch and breastfeeding on demand or 8-12 times in 24h period. 2-Encouraged maternal rest, hydration and food intake.  3-Use hand pump/ hand expression as needed  Contact LC as needed for feeds/support/concerns/questions. All questions answered at this time. Provided Lactation services brochure and promoted [employer] booklet information.     Maternal Data Has patient been taught Hand Expression?: Yes Does the patient have breastfeeding experience prior to this delivery?: No  Feeding Mother's Current Feeding Choice: Breast Milk and Formula  LATCH Score Latch: Grasps breast easily, tongue down, lips flanged, rhythmical sucking.  Audible Swallowing: Spontaneous and intermittent  Type of Nipple: Everted at rest and after stimulation  Comfort (Breast/Nipple): Soft / non-tender  Hold (Positioning): Assistance needed to correctly position infant at breast and maintain latch.  LATCH Score: 9   Lactation Tools Discussed/Used Tools: Pump;Flanges Flange Size: 24 Breast pump type: Manual Pump Education: Setup, frequency, and cleaning;Milk Storage Reason for Pumping: father wants to help with feeds Pumping frequency: as needed Pumped volume:  (drops when demonstrating)  Interventions Interventions: Breast feeding basics reviewed;Assisted with latch;Skin to  skin;Breast massage;Hand express;Adjust position;Support pillows;Education;Hand pump;Expressed milk;Position options  Discharge Pump: Manual WIC Program: Yes  Consult Status Consult Status: Follow-up Date: 10/27/20 Follow-up type: In-patient    Aamani Moose A Higuera Ancidey 10/26/2020, 5:55 PM

## 2020-10-26 NOTE — Anesthesia Postprocedure Evaluation (Signed)
Anesthesia Post Note  Patient: Gabrielle Johnson  Procedure(s) Performed: CESAREAN SECTION     Patient location during evaluation: PACU Anesthesia Type: Spinal Level of consciousness: awake and alert and oriented Pain management: pain level controlled Vital Signs Assessment: post-procedure vital signs reviewed and stable Respiratory status: spontaneous breathing, nonlabored ventilation and respiratory function stable Cardiovascular status: blood pressure returned to baseline and stable Postop Assessment: no headache, no backache, spinal receding, patient able to bend at knees and no apparent nausea or vomiting Anesthetic complications: no   No notable events documented.  Last Vitals:  Vitals:   10/26/20 1145 10/26/20 1200  BP: 110/78 125/85  Pulse: 91 (!) 112  Resp: 16 18  Temp:    SpO2: 100% 100%    Last Pain:  Vitals:   10/26/20 1200  TempSrc:   PainSc: 0-No pain   Pain Goal:    LLE Motor Response: Purposeful movement (10/26/20 1200)   RLE Motor Response: Purposeful movement (10/26/20 1200)       Epidural/Spinal Function Cutaneous sensation: Tingles (10/26/20 1200), Patient able to flex knees: No (10/26/20 1200), Patient able to lift hips off bed: No (10/26/20 1200), Back pain beyond tenderness at insertion site: No (10/26/20 1200), Progressively worsening motor and/or sensory loss: No (10/26/20 1200), Bowel and/or bladder incontinence post epidural: No (10/26/20 1200)  Lannie Fields

## 2020-10-26 NOTE — Transfer of Care (Signed)
Immediate Anesthesia Transfer of Care Note  Patient: Gabrielle Johnson  Procedure(s) Performed: CESAREAN SECTION  Patient Location: PACU  Anesthesia Type:Spinal  Level of Consciousness: awake  Airway & Oxygen Therapy: Patient Spontanous Breathing  Post-op Assessment: Report given to RN and Post -op Vital signs reviewed and stable  Post vital signs: Reviewed and stable  Last Vitals:  Vitals Value Taken Time  BP 111/61 10/26/20 1140  Temp    Pulse 90 10/26/20 1143  Resp 16 10/26/20 1143  SpO2 100 % 10/26/20 1143  Vitals shown include unvalidated device data.  Last Pain:  Vitals:   10/26/20 0739  TempSrc: Oral         Complications: No notable events documented.

## 2020-10-27 LAB — CBC
HCT: 29.3 % — ABNORMAL LOW (ref 36.0–46.0)
Hemoglobin: 9.8 g/dL — ABNORMAL LOW (ref 12.0–15.0)
MCH: 28.1 pg (ref 26.0–34.0)
MCHC: 33.4 g/dL (ref 30.0–36.0)
MCV: 84 fL (ref 80.0–100.0)
Platelets: 255 10*3/uL (ref 150–400)
RBC: 3.49 MIL/uL — ABNORMAL LOW (ref 3.87–5.11)
RDW: 14.1 % (ref 11.5–15.5)
WBC: 12.7 10*3/uL — ABNORMAL HIGH (ref 4.0–10.5)
nRBC: 0 % (ref 0.0–0.2)

## 2020-10-27 MED ORDER — FERROUS SULFATE 325 (65 FE) MG PO TABS: 325.0000 mg | ORAL_TABLET | ORAL | Status: DC

## 2020-10-27 MED ORDER — ACETAMINOPHEN 325 MG PO TABS
650.0000 mg | ORAL_TABLET | ORAL | Status: DC
  Administered 2020-10-27 – 2020-10-28 (×3): 650 mg via ORAL
  Filled 2020-10-27 (×3): qty 2

## 2020-10-27 MED ORDER — OXYCODONE HCL 5 MG PO TABS
5.0000 mg | ORAL_TABLET | ORAL | Status: DC | PRN
  Administered 2020-10-27 – 2020-10-28 (×3): 5 mg via ORAL
  Filled 2020-10-27 (×3): qty 1

## 2020-10-27 MED ORDER — IBUPROFEN 600 MG PO TABS
600.0000 mg | ORAL_TABLET | Freq: Four times a day (QID) | ORAL | Status: DC
  Administered 2020-10-27 – 2020-10-28 (×4): 600 mg via ORAL
  Filled 2020-10-27 (×4): qty 1

## 2020-10-27 NOTE — Progress Notes (Addendum)
Patient ID: Gabrielle Johnson, female   DOB: [birth year], 30 y.o.   MRN: [id number]  Post Partum Day 1 Subjective: up ad lib, tolerating PO, + flatus, and not voiding or stooling yet. She had to have an IO cath to relieve her bladder. She endorses pain around her c-section incision at 4/10 in quality. She endorses vaginal bleeding that is controlled with pads.  Objective: Blood pressure 105/65, pulse 68, temperature 98.2 F (36.8 C), temperature source Oral, resp. rate 16, height 5\' 5"  (1.651 m), weight 78.5 kg, last menstrual period 01/27/2020, SpO2 99 %, unknown if currently breastfeeding.  Physical Exam:  General: alert, cooperative, appears stated age, and no distress Lochia: appropriate Uterine Fundus: firm  Incision: healing well, dried blood present on dressing DVT Evaluation: No evidence of DVT seen on physical exam. No cords or calf tenderness. No significant calf/ankle edema.  Recent Labs    10/26/20 1329 10/27/20 0503  HGB 11.9* 9.8*  HCT 35.7* 29.3*   Assessment/Plan: Patient is overall doing well. Given lack of voids or stools, will continue prn pain management, push PO, and await return of spontaneous voids and stools before discharge likely in 1-2 days.   LOS: 1 day   10/29/20 10/27/2020, 6:52 AM   GME ATTESTATION:  I saw and evaluated the patient. I agree with the findings and the plan of care as documented in the student's note. I have made changes to documentation as necessary.  See progress note by myself for updated plan of care.   10/29/2020, MD OB Fellow, Faculty [facility], [facility] 10/27/2020 4:28 PM

## 2020-10-27 NOTE — Progress Notes (Signed)
Foley was attempted x 2 prior to successfully inserting foley. Sterile technique was used

## 2020-10-27 NOTE — Progress Notes (Signed)
Post Operative Day 1 Subjective: Patient reports she is doing well today. She states that her pain is controlled with PRN pain medications and her bleeding is similar to a period. She is eating and drinking well. She was not able to void after her foley catheter was removed this AM and had greater than 999 cc per bladder scan. A foley catheter was then replaced. She has passed flatus. She reports that breastfeeding is going well but she is supplementing with formula since her milk is still coming in. She has no other concerns at this time.   Objective: Blood pressure 120/73, pulse 69, temperature 98.1 F (36.7 C), temperature source Oral, resp. rate 18, height 5\' 5"  (1.651 m), weight 78.5 kg, last menstrual period 01/27/2020, SpO2 99 %, unknown if currently breastfeeding.  Physical Exam:  General: alert, cooperative, and no distress Lochia: appropriate Uterine Fundus: firm Incision: healing well, no significant drainage, no significant erythema, honeycomb in place DVT Evaluation: no LE edema or calf tenderness to palpation   Recent Labs    10/26/20 1329 10/27/20 0503  HGB 11.9* 9.8*  HCT 35.7* 29.3*    Assessment/Plan: Gabrielle Johnson is a 30 year old G1P1001 on POD#1 s/p pLTCS for breech presentation.   Progressing well postpartum and meeting milestones. VSS.   Acute blood loss anemia: -Hgb 11.9 on admission; down to 9.8 post-operatively  -Asymptomatic at this time -Will start PO iron  Urinary retention: -Urinary retention x2 this AM post foley removal  -Foley catheter now replaced -Will maintain in place for the rest of today -Plan to remove tomorrow AM (9/14) and reassess  Feeding: Breast and bottle Contraception: Declines  Circ: No  Dispo: Plan for discharge POD#2-3   LOS: 1 day   01-17-1974, MD 10/27/2020, 4:12 PM

## 2020-10-27 NOTE — Lactation Note (Signed)
This note was copied from a baby's chart. Lactation Consultation Note  Patient Name: Gabrielle Johnson [id number] Date: 10/27/2020 Reason for consult: Follow-up assessment;Term;Primapara;1st time breastfeeding Age:31 hours   P1 mother whose infant is now 38 hours old.  This is a term baby at 39+0 weeks.  Mother's feeding preference is breast/formula.  In house Spanish interpreter used for interpretation.  RN requested latch assistance.  Taught hand expression and finger fed drops to baby.  Assisted to latch easily and , with stimulation, he began to suck.  Demonstrated breast compressions and gentle stimulation.  Observed him feeding for 10 minutes prior to my departure.  Breast feeding basics reviewed with mother.  She will call for assistance as needed.  Father present and asleep on the couch; awakened at the end of my visit.   Maternal Data Has patient been taught Hand Expression?: Yes Does the patient have breastfeeding experience prior to this delivery?: No  Feeding Mother's Current Feeding Choice: Breast Milk and Formula  LATCH Score Latch: Grasps breast easily, tongue down, lips flanged, rhythmical sucking.  Audible Swallowing: A few with stimulation  Type of Nipple: Everted at rest and after stimulation  Comfort (Breast/Nipple): Soft / non-tender  Hold (Positioning): Assistance needed to correctly position infant at breast and maintain latch.  LATCH Score: 8   Lactation Tools Discussed/Used    Interventions Interventions: Breast feeding basics reviewed;Assisted with latch;Skin to skin;Breast massage;Hand express;Breast compression;Adjust position;Position options;Support pillows;Education  Discharge    Consult Status Consult Status: Follow-up Date: 10/28/20 Follow-up type: In-patient    [name] R [name] 10/27/2020, 8:34 AM

## 2020-10-27 NOTE — Progress Notes (Signed)
Interpreter [phone] used to explain reinsert  foley. Patient verbalized understanding

## 2020-10-28 MED ORDER — IBUPROFEN 600 MG PO TABS: 600.0000 mg | ORAL_TABLET | Freq: Four times a day (QID) | ORAL | 0 refills | Status: AC | PRN

## 2020-10-28 MED ORDER — OXYCODONE HCL 5 MG PO TABS: 5.0000 mg | ORAL_TABLET | ORAL | 0 refills | Status: AC | PRN

## 2020-10-28 MED ORDER — FERROUS SULFATE 325 (65 FE) MG PO TABS: 325.0000 mg | ORAL_TABLET | ORAL | 2 refills | Status: AC

## 2020-10-28 NOTE — Lactation Note (Signed)
This note was copied from a baby's chart. Lactation Consultation Note  Patient Name: Gabrielle Johnson [id number] Date: 10/28/2020 Reason for consult: Follow-up assessment;Primapara;1st time breastfeeding Age:31 hours   P1 mother whose infant is now 11 hours old.  This is a term baby at 39+0 weeks.  Mother's feeding preference is breast/formula.  Spanish interpreter, Taly [phone]) used for interpretation.  Mother has been breast feeding and providing formula supplementation due to baby not being satisfied, per mother.  Last LATCH score was an 8; baby is voiding/stooling well.  Mother had no further questions/concerns.  Reviewed feeding plan for after discharge.  Mother has a manual pump and our OP phone number for any concerns.   Maternal Data    Feeding Mother's Current Feeding Choice: Breast Milk and Formula  LATCH Score                    Lactation Tools Discussed/Used    Interventions Interventions: Education  Discharge Discharge Education: Engorgement and breast care Pump: Manual  Consult Status Consult Status: Complete Date: 10/28/20 Follow-up type: Call as needed    Gabrielle Johnson 10/28/2020, 11:27 AM

## 2020-11-03 ENCOUNTER — Ambulatory Visit: Payer: Self-pay

## 2020-11-10 ENCOUNTER — Telehealth (HOSPITAL_COMMUNITY): Payer: Self-pay | Admitting: *Deleted

## 2020-11-10 NOTE — Telephone Encounter (Signed)
Interpreter reports mom is feeling well. No concerns about herself. EPDS=0 (no hospital score) Interpreter reports baby is well. No concerns about baby. Feeding, peeing, and pooping without difficulty. Reviewed ABC's of safe sleep. Baby sleeps on back in bassinet.  Duffy Rhody, RN, 11-10-2020 at 2:08pm

## 2022-01-20 ENCOUNTER — Emergency Department (HOSPITAL_COMMUNITY)
Admission: EM | Admit: 2022-01-20 | Discharge: 2022-01-20 | Disposition: A | Discharge status: Home / Self Care | Payer: Self-pay | Attending: Emergency Medicine | Admitting: Emergency Medicine

## 2022-01-20 ENCOUNTER — Emergency Department (HOSPITAL_COMMUNITY): Payer: Self-pay

## 2022-01-20 DIAGNOSIS — N9489 Other specified conditions associated with female genital organs and menstrual cycle: Secondary | ICD-10-CM | POA: Insufficient documentation

## 2022-01-20 DIAGNOSIS — N644 Mastodynia: Secondary | ICD-10-CM | POA: Insufficient documentation

## 2022-01-20 LAB — CBC WITH DIFFERENTIAL/PLATELET
Abs Immature Granulocytes: 0.04 10*3/uL (ref 0.00–0.07)
Basophils Absolute: 0.1 10*3/uL (ref 0.0–0.1)
Basophils Relative: 1 %
Eosinophils Absolute: 0.3 10*3/uL (ref 0.0–0.5)
Eosinophils Relative: 3 %
HCT: 43.7 % (ref 36.0–46.0)
Hemoglobin: 14.2 g/dL (ref 12.0–15.0)
Immature Granulocytes: 1 %
Lymphocytes Relative: 37 %
Lymphs Abs: 3.2 10*3/uL (ref 0.7–4.0)
MCH: 26.7 pg (ref 26.0–34.0)
MCHC: 32.5 g/dL (ref 30.0–36.0)
MCV: 82.1 fL (ref 80.0–100.0)
Monocytes Absolute: 0.8 10*3/uL (ref 0.1–1.0)
Monocytes Relative: 9 %
Neutro Abs: 4.4 10*3/uL (ref 1.7–7.7)
Neutrophils Relative %: 49 %
Platelets: 405 10*3/uL — ABNORMAL HIGH (ref 150–400)
RBC: 5.32 MIL/uL — ABNORMAL HIGH (ref 3.87–5.11)
RDW: 13.2 % (ref 11.5–15.5)
WBC: 8.8 10*3/uL (ref 4.0–10.5)
nRBC: 0 % (ref 0.0–0.2)

## 2022-01-20 LAB — BASIC METABOLIC PANEL
Anion gap: 7 (ref 5–15)
BUN: 9 mg/dL (ref 6–20)
CO2: 23 mmol/L (ref 22–32)
Calcium: 9 mg/dL (ref 8.9–10.3)
Chloride: 106 mmol/L (ref 98–111)
Creatinine, Ser: 0.78 mg/dL (ref 0.44–1.00)
GFR, Estimated: >60 mL/min (ref >60)
Glucose, Bld: 90 mg/dL (ref 70–99)
Potassium: 4 mmol/L (ref 3.5–5.1)
Sodium: 136 mmol/L (ref 135–145)

## 2022-01-20 LAB — I-STAT BETA HCG BLOOD, ED (MC, WL, AP ONLY): I-stat hCG, quantitative: <5 m[IU]/mL (ref <5)

## 2022-01-20 MED ORDER — DOXYCYCLINE HYCLATE 100 MG PO CAPS: 100.0000 mg | ORAL_CAPSULE | Freq: Two times a day (BID) | ORAL | 0 refills | Status: AC

## 2022-01-20 NOTE — ED Triage Notes (Signed)
Patient here with complaint of left breast pain that radiates into her left axilla that started five or six days ago. Patient is alert, oriented, and in no apparent distress at this time.

## 2022-01-20 NOTE — ED Notes (Signed)
Pt Reassessed, NAD

## 2022-01-20 NOTE — ED Provider Triage Note (Signed)
Emergency Medicine Provider Triage Evaluation Note  Gabrielle Johnson , a 31 y.o. female  was evaluated in triage.  Pt complains of 1 week of left breast tenderness.  She says actually started in her left axilla and then about 3 days ago she started having tenderness in her left breast.  She says sometimes when she would feel it she feels something hard.  She says she is also noticed some white discharge from her nipple.  She is confused about this because she has not breast-fed in several years.  She says she does not think she could be pregnant and had her last menstrual period at the end of November.  Denies any fevers or chills, chest pain, shortness of breath. Review of Systems  Positive:  Negative:   Physical Exam  BP (!) 133/91 (BP Location: Right Arm)   Pulse 71   Temp 98.6 F (37 C)   Resp 17   SpO2 100%  Gen:   Awake, no distress   Resp:  Normal effort  MSK:   Moves extremities without difficulty  Other:  I do not feel any obvious masses or axillary adenopathy on breast exam over her EXTR, but she will need more thorough exam in the back.  Medical Decision Making  Medically screening exam initiated at 5:47 PM.  Appropriate orders placed.  Gabrielle Johnson was informed that the remainder of the evaluation will be completed by another provider, this initial triage assessment does not replace that evaluation, and the importance of remaining in the ED until their evaluation is complete.     Claudie Leach, [place] 01/20/22 1749

## 2022-01-20 NOTE — ED Provider Notes (Signed)
[facility] EMERGENCY DEPARTMENT Provider Note   CSN: [id number] Arrival date & time: 01/20/22  1525     History  Chief Complaint  Patient presents with   Breast Pain    Gabrielle Johnson is a 31 y.o. female.  The history is provided by the patient. The history is limited by a language barrier. A language interpreter was used.     Patient with medical history of prior C-section presents to the emergency department due to left-sided breast pain x 10 days.  Pain is remittent, she also feels in her left axilla which is worse with movement.  Endorses nipple discharge is what prompted today's ED visit, unable to see her primary until next month.  She is not breast-feeding, has not breast-fed in over a year.  Denies any rashes, history of breast cancer.  Has not tried any medicine prior to arrival.  Home Medications Prior to Admission medications   Medication Sig Start Date End Date Taking? Authorizing Provider  doxycycline (VIBRAMYCIN) 100 MG capsule Take 1 capsule (100 mg total) by mouth 2 (two) times daily. 01/20/22  Yes Theron Arista, PA-C  ferrous sulfate 325 (65 FE) MG tablet Take 1 tablet (325 mg total) by mouth every other day. 10/28/20   Arabella Merles, CNM  ibuprofen (ADVIL) 600 MG tablet Take 1 tablet (600 mg total) by mouth every 6 (six) hours as needed. 10/28/20   Cam Hai D, CNM  oxyCODONE (OXY IR/ROXICODONE) 5 MG immediate release tablet Take 1-2 tablets (5-10 mg total) by mouth every 4 (four) hours as needed for moderate pain, severe pain or breakthrough pain. 10/28/20   Arabella Merles, CNM  Prenatal Vit-Fe Fumarate-FA (MULTIVITAMIN-PRENATAL) 27-0.8 MG TABS tablet Take 1 tablet by mouth daily.    [provider]      Allergies    Patient has no known allergies.    Review of Systems   Review of Systems  Physical Exam Updated Vital Signs BP 128/85   Pulse 88   Temp 98.6 F (37 C)   Resp 16   SpO2 100%  Physical Exam Vitals and nursing  note reviewed. Exam conducted with a chaperone present.  Constitutional:      General: She is not in acute distress.    Appearance: Normal appearance.  HENT:     Head: Normocephalic and atraumatic.  Eyes:     General: No scleral icterus.    Extraocular Movements: Extraocular movements intact.     Pupils: Pupils are equal, round, and reactive to light.  Skin:    Coloration: Skin is not jaundiced.     Comments: Skin exam and breast exam performed with female chaperone in room.  She does not have any reproducible tenderness on breast exam, I feel some slight fibrocystic changes but no palpable masses.  There is no discharge expressed on my exam.  I do not appreciate any lymphadenopathy to the left axilla or any induration or cellulitis.  Neurological:     Mental Status: She is alert. Mental status is at baseline.     Coordination: Coordination normal.     ED Results / Procedures / Treatments   Labs (all labs ordered are listed, but only abnormal results are displayed) Labs Reviewed  CBC WITH DIFFERENTIAL/PLATELET - Abnormal; Notable for the following components:      Result Value   RBC 5.32 (*)    Platelets 405 (*)    All other components within normal limits  BASIC METABOLIC PANEL  I-STAT  BETA HCG BLOOD, ED ([facility], [facility], [facility] ONLY)    EKG None  Radiology DG Chest Port 1 View  Result Date: 01/20/2022 CLINICAL DATA:  Breast pain. EXAM: PORTABLE CHEST 1 VIEW COMPARISON:  None Available. FINDINGS: No pleural effusion. No pneumothorax. Normal cardiac and mediastinal contours. No focal airspace opacity. No displaced rib fractures. Visualized upper abdomen is unremarkable. IMPRESSION: No focal airspace opacity. Electronically Signed   By: Lorenza Cambridge M.D.   On: 01/20/2022 18:38    Procedures Procedures    Medications Ordered in ED Medications - No data to display  ED Course/ Medical Decision Making/ A&P                           Medical Decision Making Risk Prescription drug  management.   This is a 31 year old female presenting today due to left breast pain and nipple discharge.  On exam I do not appreciate any mass or obvious abscess.  There is no discharge expressed from the nipples with exam.  I also do not see any streaking cellulitis or warmth obese just above the underlying cellulitis.  She is not breast-feeding, I do not think this is mastitis.  She is afebrile, no SIRS criteria or septic.  Will cover with antibiotic given she is reporting nipple discharge.  She has no history of breast cancer, will have her follow-up with her gynecologist and she may need yearly mammogram.  Laboratory workup and chest x-ray was ordered in triage.  I viewed and interpreted labs personally which are negative for leukocytosis, she is not pregnant and there is no gross electrolyte derangement or AKI.  Chest x-ray was also viewed by myself and negative for infectious process.  Do not see any indication for additional workup or hospitalization.         Final Clinical Impression(s) / ED Diagnoses Final diagnoses:  Breast pain, left    Rx / DC Orders ED Discharge Orders          Ordered    doxycycline (VIBRAMYCIN) 100 MG capsule  2 times daily        01/20/22 1907              Theron Arista, Cordelia Poche 01/20/22 1912    Eber Hong, MD 01/22/22 1230

## 2022-01-20 NOTE — Discharge Instructions (Signed)
You are seen today in the emergency department for breast pain.  Take the antibiotic as prescribed due to the description of nipple discharge.  The rest of your workup was reassuring, I would follow-up with your gynecologist regarding this finding.  Please call the attached number and schedule appointment if you do not currently have 1.  Return to the ED for fevers, spreading redness, new or concerning symptoms.  Hoy la atienden en el departamento de emergencias por [employer] senos.  Tome el antibitico segn lo prescrito debido a la descripcin de la secrecin del pezn.  El resto de su evaluacin fue tranquilizador, hara un seguimiento con su gineclogo con respecto a Sports coach.  Llame al nmero adjunto y programe una cita si actualmente no tiene 1.  Regrese a la sala de emergencias si tiene fiebre, enrojecimiento que se extiende, sntomas nuevos o preocupantes.

## 2022-01-20 NOTE — ED Notes (Addendum)
This RN assumed care of patient. Pt BIB by self. Pt arrived to ED presenting without white discharge from right breast, but pt reports having it. Pt reporting 8/10 pain starting about 10 days ago and worsened in the past 5 days. PT was told by PCP "to come to ER if symptoms get worse". Pt presents with spontaneous breathing, equal bilaterally, and speaks full sentence without difficulty. Pt has not breast fed child for about 12 months. Pt skin tone is appropriate for ethnicity, dry and warm. Pt connected to pulse ox and BP. Support person at bedside. Spanish interpreter used to communicate with patient.
